# Patient Record
Sex: Female | Born: 1945 | Race: White | Hispanic: No | Smoking: Never smoker
Health system: Southern US, Community
[De-identification: ages and names within clinical notes are randomized; demographics above are authoritative.]

## PROBLEM LIST (undated history)

## (undated) DIAGNOSIS — I10 Essential (primary) hypertension: Secondary | ICD-10-CM

## (undated) DIAGNOSIS — I4891 Unspecified atrial fibrillation: Secondary | ICD-10-CM

## (undated) DIAGNOSIS — E039 Hypothyroidism, unspecified: Secondary | ICD-10-CM

## (undated) DIAGNOSIS — C801 Malignant (primary) neoplasm, unspecified: Secondary | ICD-10-CM

## (undated) DIAGNOSIS — F32A Depression, unspecified: Secondary | ICD-10-CM

## (undated) DIAGNOSIS — F419 Anxiety disorder, unspecified: Secondary | ICD-10-CM

## (undated) DIAGNOSIS — I509 Heart failure, unspecified: Secondary | ICD-10-CM

## (undated) DIAGNOSIS — F329 Major depressive disorder, single episode, unspecified: Secondary | ICD-10-CM

## (undated) HISTORY — PX: HERNIA REPAIR: SHX51

## (undated) HISTORY — PX: NASAL SINUS SURGERY: SHX719

---

## 2005-01-17 ENCOUNTER — Ambulatory Visit: Payer: Self-pay

## 2006-01-20 ENCOUNTER — Ambulatory Visit: Payer: Self-pay

## 2007-01-26 ENCOUNTER — Ambulatory Visit: Payer: Self-pay | Admitting: Internal Medicine

## 2007-05-29 ENCOUNTER — Ambulatory Visit: Payer: Self-pay | Admitting: Unknown Physician Specialty

## 2008-01-27 ENCOUNTER — Ambulatory Visit: Payer: Self-pay | Admitting: Internal Medicine

## 2009-02-23 ENCOUNTER — Ambulatory Visit: Payer: Self-pay | Admitting: Internal Medicine

## 2010-02-26 ENCOUNTER — Ambulatory Visit: Payer: Self-pay | Admitting: Internal Medicine

## 2011-02-28 ENCOUNTER — Ambulatory Visit: Payer: Self-pay | Admitting: Internal Medicine

## 2012-03-02 ENCOUNTER — Ambulatory Visit: Payer: Self-pay | Admitting: Internal Medicine

## 2012-12-07 ENCOUNTER — Inpatient Hospital Stay: Payer: Self-pay | Admitting: Internal Medicine

## 2012-12-07 LAB — CBC
HCT: 40.5 % (ref 35.0–47.0)
HGB: 14.1 g/dL (ref 12.0–16.0)
MCH: 31.8 pg (ref 26.0–34.0)
MCHC: 34.9 g/dL (ref 32.0–36.0)
MCV: 91 fL (ref 80–100)
Platelet: 267 10*3/uL (ref 150–440)
RBC: 4.44 10*6/uL (ref 3.80–5.20)
WBC: 7.7 10*3/uL (ref 3.6–11.0)

## 2012-12-07 LAB — BASIC METABOLIC PANEL
BUN: 10 mg/dL (ref 7–18)
Calcium, Total: 9.1 mg/dL (ref 8.5–10.1)
Co2: 27 mmol/L (ref 21–32)
Creatinine: 0.93 mg/dL (ref 0.60–1.30)
Potassium: 3.9 mmol/L (ref 3.5–5.1)

## 2012-12-07 LAB — CK TOTAL AND CKMB (NOT AT ARMC)
CK-MB: 1 ng/mL (ref 0.5–3.6)
CK-MB: 0.9 ng/mL (ref 0.5–3.6)

## 2012-12-07 LAB — URINALYSIS, COMPLETE
Glucose,UR: NEGATIVE mg/dL (ref 0–75)
Leukocyte Esterase: NEGATIVE
RBC,UR: <1 /HPF (ref 0–5)
Squamous Epithelial: <1
WBC UR: 3 /HPF (ref 0–5)

## 2012-12-07 LAB — T4, FREE: Free Thyroxine: 1.02 ng/dL (ref 0.76–1.46)

## 2012-12-07 LAB — TROPONIN I: Troponin-I: 0.03 ng/mL

## 2012-12-08 LAB — TSH: Thyroid Stimulating Horm: 5.89 u[IU]/mL — ABNORMAL HIGH

## 2013-03-26 ENCOUNTER — Ambulatory Visit: Payer: Self-pay

## 2013-07-23 ENCOUNTER — Ambulatory Visit: Payer: Self-pay | Admitting: Unknown Physician Specialty

## 2014-04-13 ENCOUNTER — Ambulatory Visit: Payer: Self-pay | Admitting: Internal Medicine

## 2014-07-01 NOTE — Discharge Summary (Signed)
PATIENT NAME:  Chelsea Miller, Chelsea Miller MR#:  [id number] DATE OF BIRTH:  10-25-1945  DATE OF ADMISSION:  12/07/2012 DATE OF DISCHARGE:  12/09/2012  FINAL DIAGNOSES:  1.  Atrial fibrillation with rapid ventricular rate.  2.  Hypertension.  3.  Anxiety with depression.   HISTORY AND PHYSICAL: Please see dictated admission history and physical.   [place]: The patient was admitted with atrial fibrillation with rapid ventricular rate. This is new onset atrial fibrillation. Cardiac enzymes were followed, which were negative. Electrolytes were unremarkable. Thyroid was slightly underactive and she was resumed on her home medications for hypothyroidism. She was placed on Cardizem and this was titrated upward. She converted back into normal sinus rhythm. Her Cardizem was consolidated into a single daily dose, which she tolerated well. She was ambulating without issue on this dose.   Echocardiogram was performed, which revealed preserved LV function with moderate tricuspid regurgitation and some ventricular hypertrophy, but no evidence of congestive heart failure. Cardiology saw the patient and they recommended aspirin therapy for anticoagulation and the patient was continued on this.   At this time, the patient will be discharged home in stable condition with physical activity up as tolerated. She will follow a 2 gram sodium diet. She will follow up in our office within the next 2 weeks. She may return to work on 12/14/2012.  DISCHARGE MEDICATIONS:  1.  Synthroid 0.05 mg p.o. daily.  2.  Lexapro 10 mg p.o. daily.  3.  Trazodone 50 mg p.o. at bedtime.  4.  Xanax 1 mg 1 p.o. b.i.d. p.r.n. anxiety.  5.  Multivitamin 1 p.o. daily.  6.  Calcium 600 mg p.o. daily.  7.  Colace 100 mg p.o. daily.  8.  Premarin vaginal suppository once a month.  9.  Aspirin 324 mg p.o. daily.  10. Cardizem CD 240 mg p.o. daily.  11. She was given instructions to hold lisinopril, vitamin E and vitamin C.   ____________________________ Adin Hector, MD bjk:aw D: 12/09/2012 12:58:14 ET T: 12/09/2012 13:06:22 ET JOB#: [id number]  cc: Adin Hector, MD, <Dictator> Isaias Cowman, MD Ramonita Lab MD ELECTRONICALLY SIGNED 12/12/2012 12:46

## 2014-07-01 NOTE — H&P (Signed)
PATIENT NAME:  Chelsea Miller, Chelsea Miller MR#:  [id number] DATE OF BIRTH:  02/12/1946  DATE OF ADMISSION:  12/07/2012  PRIMARY CARE PHYSICIAN: Ramonita Lab, MD   CHIEF COMPLAINT: Palpitations today.   HISTORY OF PRESENT ILLNESS: Chelsea Miller is a pleasant 69 year old Caucasian female with past medical history of hypertension and hypothyroidism who comes to the Emergency Room after she started having significant palpitations, felt like "my heart was going to jump out"  in the morning. She came to the Emergency Room and was found to be in rapid A-fib with heart rate in the 110s to 120. She received 2 doses of IV Cardizem. Internal medicine was contacted for further work-up and evaluation of new onset rapid A-fib. The patient denies currently at present chest pain. Her heart rate is anywhere from 80 to 113, irregularly irregular. It increases to 150s with moving in the bed. Blood pressure is currently stable.   PAST MEDICAL HISTORY: 1.  Hypothyroidism.  2.  Hypertension.  3.  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   ALLERGIES: IVP DYE, SULFA AND LATEX.   MEDICATIONS: 1.  Xanax 1 mg twice a day as needed.  2.  Vitamin E 400 international units daily.  3.  Vitamin C 500 mg p.o. daily.  4.  Trazodone 50 mg at bedtime.  5.  Synthroid 50 mcg p.o. daily.  6.  Premarin cream once a month.  7.  Multivitamin once a day.  8.  Lisinopril 20 mg daily.  9.  Lexapro 10 mg daily.  10.  Colace 100 mg once a day at bedtime.  11.  Calcium 600 with vitamin D p.o. daily.   FAMILY HISTORY: Positive for hypertension.   SOCIAL HISTORY: Nonsmoker, nonalcoholic.   REVIEW OF SYSTEMS:  CONSTITUTIONAL: No fever. Positive for fatigue and weakness.  EYES: No blurred or double vision, glaucoma or cataracts.  ENT: No tinnitus, ear pain, hearing loss.  RESPIRATORY: No cough, wheeze, hemoptysis.  CARDIOVASCULAR: No orthopnea, edema, chest pain. Positive for palpitations. No shortness of breath.  GASTROINTESTINAL: No nausea,  vomiting, diarrhea, abdominal pain.  GENITOURINARY: No dysuria or hematuria.  ENDOCRINE: No polyuria, nocturia or thyroid problems.  HEMATOLOGY: No anemia or easy bruising.  SKIN: No acne, rash or any other lesions.  MUSCULOSKELETAL: Positive for arthritis. No swelling of joints or gout.  NEUROMUSCULAR: No CVA, TIA, dysarthria, ataxia or vertigo.  PSYCHIATRIC: No anxiety or depression. No bipolar disorder. All other systems reviewed and negative.   PHYSICAL EXAMINATION: GENERAL: The patient is awake, alert and oriented x 3, not in acute distress.  VITAL SIGNS: Afebrile, pulse is 89 to 113 and irregularly irregular, respirations 18, blood pressure 141/86 and pulse ox is 98% on room air.  HEENT: Atraumatic, normocephalic. Pupils are equal, round and reactive to light and accommodation. EOM intact. Oral mucosa is moist.  NECK: Supple. No JVD. No carotid bruit.  LUNGS: Clear to auscultation bilaterally. No rales, rhonchi, respiratory distress or labored breathing.  HEART: Both the heart sounds are normal. Rate and rhythm regular. PMI not lateralized. Chest is nontender.  EXTREMITIES: Good pedal pulses. Good femoral pulses. No lower extremity edema.  ABDOMEN: Soft, benign, nontender. No organomegaly. Positive bowel sounds.  NEUROLOGIC: Grossly intact cranial nerves II through XII. No motor or sensory deficit.  PSYCHIATRIC: Awake, alert and oriented x 3.   LABORATORY AND DIAGNOSTICS: EKG shows rapid A-fib.  CBC within normal limits.   Chest x-ray within normal limits.  Basic metabolic panel within normal limits.  Free thyroxine is  1.02.   ASSESSMENT AND PLAN: 69 year old Chelsea Miller with history of hypertension and hypothyroidism comes in with palpitations and is being admitted with:  1.  Rapid atrial fibrillation, new onset, with rapid ventricular response. The patient received a couple doses of Cardizem, slowed her heart rate down into the 80s to 113 which still increases to 140s  with activity in the Emergency Room. She is going to be admitted on telemetry floor. We will start her on p.o. Cardizem 30 q. 6 hourly. Her CHADS-2 score is 1. Will continue enteric-coated aspirin at this time. Cardiology consultation in the morning. We will defer further anticoagulation if needed depending on the patient's heart rate and rhythm, per cardiology.  2.  Hypertension. The patient is going to be on Cardizem. I will hold off on her home dose lisinopril to avoid hypotension.  3.  Anxiety. P.r.n. Xanax.  4.  Hypothyroidism. Continue Synthroid.  5.  Deep vein thrombosis prophylaxis. Subcutaneous heparin t.i.d.   Further work-up according to the patient's clinical course. Hospital admission plan was discussed with the patient and the patient's family members.   TIME SPENT: 50 minutes.  ____________________________ Hart Rochester Posey Pronto, MD sap:sb D: 12/07/2012 16:55:35 ET T: 12/07/2012 17:11:59 ET JOB#: [id number]  cc: Thadd Apuzzo A. Posey Pronto, MD, <Dictator> Tama High III, MD Ilda Basset MD ELECTRONICALLY SIGNED 12/08/2012 14:24

## 2014-07-01 NOTE — Consult Note (Signed)
PATIENT NAME:  Chelsea Miller, Chelsea Miller MR#:  [id number] DATE OF BIRTH:  February 01, 1946  CARDIOLOGY CONSULTATION   DATE OF CONSULTATION:  12/08/2012  CONSULTING PHYSICIAN:  Isaias Cowman, MD  PRIMARY CARE PHYSICIAN: Adin Hector, MD  CHIEF COMPLAINT: Palpitations.   REASON FOR CONSULTATION: Consultation is requested for evaluation of atrial fibrillation.   HISTORY OF PRESENT ILLNESS: The patient is a 69 year old female with a history of hypertension and hypothyroidism. The patient apparently was in her usual state of health until the day of admission, when she started to experience palpitations and heart racing. She presented to [facility] Emergency Room, where she was noted to be in atrial fibrillation with a rapid ventricular rate. The patient was treated with intravenous Cardizem and was admitted to telemetry. The patient has ruled out for myocardial infarction by CPK isoenzymes and troponin. The patient denies palpitations today. She remains in atrial fibrillation with modest rapid ventricular rate. She was started on Cardizem.   PAST MEDICAL HISTORY:  1. Hypertension.  2. Hypothyroidism.  3. Squamous cell carcinoma of the leg.   MEDICATIONS:  1. Lisinopril 20 mg daily.  2. Xanax 1 mg b.i.d. p.r.n. 3. Vitamin E 400 international units daily.  4. Vitamin C 500 mg daily.  5. Trazodone 50 mg at bedtime.  6. Synthroid 50 mcg daily.  7. Premarin cream daily.  8. Multivitamin 1 daily. 9. Lexapro 10 mg daily. 10. Colace 100 mg at bedtime.  11. Calcium 600 with vitamin D 1 daily.   SOCIAL HISTORY: The patient is divorced. She has 2 children. She denies tobacco abuse or EtOH abuse.   FAMILY HISTORY: No immediate family history of coronary artery disease or myocardial infarction.   REVIEW OF SYSTEMS:  CONSTITUTIONAL: No fever or chills.  EYES: No blurry vision.  EARS: No hearing loss.  RESPIRATORY: No shortness of breath.  CARDIOVASCULAR: Palpitations and heart racing, as described  above.  GASTROINTESTINAL: No nausea, vomiting or diarrhea.  GENITOURINARY: No dysuria or hematuria.  ENDOCRINE: No polyuria or polydipsia.  MUSCULOSKELETAL: No arthralgias or myalgias.  NEUROLOGICAL: No focal muscle weakness or numbness.  PSYCHOLOGICAL: No depression or anxiety.   PHYSICAL EXAMINATION:  VITAL SIGNS: Blood pressure 110/70, pulse 104, respirations 16, temperature 97.4, pulse oximetry 94%.  HEENT: Pupils equal and reactive to light and accommodation.  NECK: Supple without thyromegaly.  LUNGS: Clear.  CARDIOVASCULAR: Normal JVP. Normal PMI. Irregularly irregular rhythm. Normal S1, S2. No appreciable gallop, murmur or rub.  ABDOMEN: Soft and nontender. Pulses were intact bilaterally.  MUSCULOSKELETAL: Normal muscle tone.  NEUROLOGICAL: The patient is alert and oriented x3. Motor and sensory both grossly intact.   IMPRESSION: A 69 year old female who presents with new-onset atrial fibrillation with a rapid ventricular rate. The patient has ruled out for myocardial infarction by CPK isoenzymes and troponin. She has a CHADS2 score of 1, currently on aspirin.   RECOMMENDATIONS:  1. Agree with overall current therapy.  2. Continue aspirin for stroke prevention.  3. Up-titrate Cardizem to control heart rate.  4. Review 2-D echocardiogram.   ____________________________ Isaias Cowman, MD ap:OSi D: 12/08/2012 12:55:46 ET T: 12/08/2012 13:03:21 ET JOB#: [id number]  cc: Isaias Cowman, MD, <Dictator> Isaias Cowman MD ELECTRONICALLY SIGNED 12/21/2012 11:26

## 2015-04-17 ENCOUNTER — Other Ambulatory Visit: Payer: Self-pay | Admitting: Internal Medicine

## 2015-04-17 DIAGNOSIS — Z1231 Encounter for screening mammogram for malignant neoplasm of breast: Secondary | ICD-10-CM

## 2015-04-18 ENCOUNTER — Other Ambulatory Visit: Payer: Self-pay | Admitting: Internal Medicine

## 2015-04-18 ENCOUNTER — Ambulatory Visit
Admission: RE | Admit: 2015-04-18 | Discharge: 2015-04-18 | Disposition: A | Discharge status: Home / Self Care | Payer: Medicare Other | Source: Ambulatory Visit | Attending: Internal Medicine | Admitting: Internal Medicine

## 2015-04-18 DIAGNOSIS — Z1231 Encounter for screening mammogram for malignant neoplasm of breast: Secondary | ICD-10-CM

## 2015-04-18 HISTORY — DX: Malignant (primary) neoplasm, unspecified: C80.1

## 2015-05-30 ENCOUNTER — Encounter: Payer: Self-pay | Admitting: Emergency Medicine

## 2015-05-30 ENCOUNTER — Emergency Department: Payer: Medicare Other

## 2015-05-30 ENCOUNTER — Observation Stay
Admission: EM | Admit: 2015-05-30 | Discharge: 2015-05-31 | Disposition: A | Discharge status: Home / Self Care | Payer: Medicare Other | Attending: Internal Medicine | Admitting: Internal Medicine

## 2015-05-30 DIAGNOSIS — F329 Major depressive disorder, single episode, unspecified: Secondary | ICD-10-CM | POA: Diagnosis not present

## 2015-05-30 DIAGNOSIS — R0602 Shortness of breath: Secondary | ICD-10-CM | POA: Insufficient documentation

## 2015-05-30 DIAGNOSIS — Z91040 Latex allergy status: Secondary | ICD-10-CM | POA: Insufficient documentation

## 2015-05-30 DIAGNOSIS — Z79899 Other long term (current) drug therapy: Secondary | ICD-10-CM | POA: Diagnosis not present

## 2015-05-30 DIAGNOSIS — J90 Pleural effusion, not elsewhere classified: Secondary | ICD-10-CM | POA: Diagnosis not present

## 2015-05-30 DIAGNOSIS — R112 Nausea with vomiting, unspecified: Secondary | ICD-10-CM | POA: Insufficient documentation

## 2015-05-30 DIAGNOSIS — R0789 Other chest pain: Secondary | ICD-10-CM | POA: Diagnosis not present

## 2015-05-30 DIAGNOSIS — I480 Paroxysmal atrial fibrillation: Secondary | ICD-10-CM | POA: Insufficient documentation

## 2015-05-30 DIAGNOSIS — Z7982 Long term (current) use of aspirin: Secondary | ICD-10-CM | POA: Insufficient documentation

## 2015-05-30 DIAGNOSIS — Z91041 Radiographic dye allergy status: Secondary | ICD-10-CM | POA: Diagnosis not present

## 2015-05-30 DIAGNOSIS — F419 Anxiety disorder, unspecified: Secondary | ICD-10-CM | POA: Insufficient documentation

## 2015-05-30 DIAGNOSIS — I491 Atrial premature depolarization: Secondary | ICD-10-CM | POA: Diagnosis not present

## 2015-05-30 DIAGNOSIS — I081 Rheumatic disorders of both mitral and tricuspid valves: Secondary | ICD-10-CM | POA: Insufficient documentation

## 2015-05-30 DIAGNOSIS — Z7901 Long term (current) use of anticoagulants: Secondary | ICD-10-CM | POA: Insufficient documentation

## 2015-05-30 DIAGNOSIS — I5032 Chronic diastolic (congestive) heart failure: Secondary | ICD-10-CM | POA: Insufficient documentation

## 2015-05-30 DIAGNOSIS — Z882 Allergy status to sulfonamides status: Secondary | ICD-10-CM | POA: Diagnosis not present

## 2015-05-30 DIAGNOSIS — R079 Chest pain, unspecified: Secondary | ICD-10-CM | POA: Diagnosis present

## 2015-05-30 DIAGNOSIS — E039 Hypothyroidism, unspecified: Secondary | ICD-10-CM | POA: Insufficient documentation

## 2015-05-30 DIAGNOSIS — E785 Hyperlipidemia, unspecified: Secondary | ICD-10-CM | POA: Diagnosis not present

## 2015-05-30 DIAGNOSIS — I110 Hypertensive heart disease with heart failure: Secondary | ICD-10-CM | POA: Diagnosis not present

## 2015-05-30 DIAGNOSIS — I509 Heart failure, unspecified: Secondary | ICD-10-CM | POA: Diagnosis present

## 2015-05-30 DIAGNOSIS — Z8249 Family history of ischemic heart disease and other diseases of the circulatory system: Secondary | ICD-10-CM | POA: Diagnosis not present

## 2015-05-30 HISTORY — DX: Heart failure, unspecified: I50.9

## 2015-05-30 HISTORY — DX: Depression, unspecified: F32.A

## 2015-05-30 HISTORY — DX: Anxiety disorder, unspecified: F41.9

## 2015-05-30 HISTORY — DX: Unspecified atrial fibrillation: I48.91

## 2015-05-30 HISTORY — DX: Essential (primary) hypertension: I10

## 2015-05-30 HISTORY — DX: Hypothyroidism, unspecified: E03.9

## 2015-05-30 HISTORY — DX: Major depressive disorder, single episode, unspecified: F32.9

## 2015-05-30 LAB — CBC
HCT: 34 % — ABNORMAL LOW (ref 35.0–47.0)
Hemoglobin: 11.5 g/dL — ABNORMAL LOW (ref 12.0–16.0)
MCH: 31.7 pg (ref 26.0–34.0)
MCHC: 33.8 g/dL (ref 32.0–36.0)
MCV: 93.6 fL (ref 80.0–100.0)
PLATELETS: 211 10*3/uL (ref 150–440)
RBC: 3.63 MIL/uL — ABNORMAL LOW (ref 3.80–5.20)
RDW: 13.1 % (ref 11.5–14.5)
WBC: 7.7 10*3/uL (ref 3.6–11.0)

## 2015-05-30 LAB — BASIC METABOLIC PANEL
ANION GAP: 5 (ref 5–15)
BUN: 12 mg/dL (ref 6–20)
CALCIUM: 8.8 mg/dL — AB (ref 8.9–10.3)
CO2: 24 mmol/L (ref 22–32)
Chloride: 105 mmol/L (ref 101–111)
Creatinine, Ser: 0.77 mg/dL (ref 0.44–1.00)
Glucose, Bld: 116 mg/dL — ABNORMAL HIGH (ref 65–99)
POTASSIUM: 3.9 mmol/L (ref 3.5–5.1)
Sodium: 134 mmol/L — ABNORMAL LOW (ref 135–145)

## 2015-05-30 LAB — LIPID PANEL
CHOL/HDL RATIO: 3 ratio
CHOLESTEROL: 221 mg/dL — AB (ref 0–200)
HDL: 74 mg/dL (ref >40)
LDL CALC: 116 mg/dL — AB (ref 0–99)
Triglycerides: 153 mg/dL — ABNORMAL HIGH (ref <150)
VLDL: 31 mg/dL (ref 0–40)

## 2015-05-30 LAB — FIBRIN DERIVATIVES D-DIMER (ARMC ONLY): Fibrin derivatives D-dimer (ARMC): 745 — ABNORMAL HIGH (ref 0–499)

## 2015-05-30 LAB — TROPONIN I
TROPONIN I: 0.03 ng/mL (ref <0.031)
Troponin I: <0.03 ng/mL (ref <0.031)

## 2015-05-30 MED ORDER — HYDRALAZINE HCL 20 MG/ML IJ SOLN: 10.0000 mg | Freq: Four times a day (QID) | INTRAMUSCULAR | Status: DC | PRN

## 2015-05-30 MED ORDER — ACETAMINOPHEN 650 MG RE SUPP: 650.0000 mg | Freq: Four times a day (QID) | RECTAL | Status: DC | PRN

## 2015-05-30 MED ORDER — ASPIRIN 81 MG PO CHEW
324.0000 mg | CHEWABLE_TABLET | Freq: Once | ORAL | Status: AC
  Administered 2015-05-30: 324 mg via ORAL
  Filled 2015-05-30: qty 4

## 2015-05-30 MED ORDER — VITAMIN C 500 MG PO TABS
500.0000 mg | ORAL_TABLET | Freq: Every day | ORAL | Status: DC
  Administered 2015-05-31: 500 mg via ORAL
  Filled 2015-05-30: qty 1

## 2015-05-30 MED ORDER — CITALOPRAM HYDROBROMIDE 20 MG PO TABS
20.0000 mg | ORAL_TABLET | Freq: Once | ORAL | Status: AC
  Administered 2015-05-30: 20 mg via ORAL

## 2015-05-30 MED ORDER — RIZATRIPTAN BENZOATE 10 MG PO TBDP: 10.0000 mg | ORAL_TABLET | ORAL | Status: DC | PRN

## 2015-05-30 MED ORDER — FUROSEMIDE 10 MG/ML IJ SOLN: 40.0000 mg | Freq: Every day | INTRAMUSCULAR | Status: DC

## 2015-05-30 MED ORDER — LISINOPRIL 20 MG PO TABS: 20.0000 mg | ORAL_TABLET | Freq: Every day | ORAL | Status: DC

## 2015-05-30 MED ORDER — ACETAMINOPHEN 325 MG PO TABS
650.0000 mg | ORAL_TABLET | Freq: Four times a day (QID) | ORAL | Status: DC | PRN
Start: 2015-05-30 — End: 2015-05-31
  Administered 2015-05-30: 650 mg via ORAL
  Filled 2015-05-30: qty 2

## 2015-05-30 MED ORDER — CITALOPRAM HYDROBROMIDE 20 MG PO TABS
20.0000 mg | ORAL_TABLET | Freq: Every day | ORAL | Status: DC
  Administered 2015-05-31: 20 mg via ORAL
  Filled 2015-05-30 (×2): qty 1

## 2015-05-30 MED ORDER — METOPROLOL SUCCINATE ER 50 MG PO TB24: 50.0000 mg | ORAL_TABLET | Freq: Every day | ORAL | Status: DC

## 2015-05-30 MED ORDER — ALPRAZOLAM 1 MG PO TABS
1.0000 mg | ORAL_TABLET | Freq: Three times a day (TID) | ORAL | Status: DC | PRN
  Administered 2015-05-30: 2 mg via ORAL
  Filled 2015-05-30: qty 2

## 2015-05-30 MED ORDER — SODIUM CHLORIDE 0.9% FLUSH
3.0000 mL | Freq: Two times a day (BID) | INTRAVENOUS | Status: DC
  Administered 2015-05-30 – 2015-05-31 (×2): 3 mL via INTRAVENOUS

## 2015-05-30 MED ORDER — FUROSEMIDE 10 MG/ML IJ SOLN
40.0000 mg | Freq: Every day | INTRAMUSCULAR | Status: DC
  Administered 2015-05-31: 40 mg via INTRAVENOUS
  Filled 2015-05-30 (×2): qty 4

## 2015-05-30 MED ORDER — CALCIUM CARBONATE-VITAMIN D 500-200 MG-UNIT PO TABS
1.0000 | ORAL_TABLET | Freq: Every day | ORAL | Status: DC
Start: 2015-05-31 — End: 2015-05-31
  Administered 2015-05-31: 1 via ORAL
  Filled 2015-05-30: qty 1

## 2015-05-30 MED ORDER — CALCIUM-MAGNESIUM-VITAMIN D ER 600-40-500 MG-MG-UNIT PO TB24: 1.0000 | ORAL_TABLET | Freq: Every day | ORAL | Status: DC

## 2015-05-30 MED ORDER — FUROSEMIDE 10 MG/ML IJ SOLN
40.0000 mg | Freq: Once | INTRAMUSCULAR | Status: AC
Start: 2015-05-30 — End: 2015-05-30
  Administered 2015-05-30: 40 mg via INTRAVENOUS
  Filled 2015-05-30: qty 4

## 2015-05-30 MED ORDER — LEVOTHYROXINE SODIUM 50 MCG PO TABS
50.0000 ug | ORAL_TABLET | Freq: Every day | ORAL | Status: DC
  Administered 2015-05-31: 50 ug via ORAL
  Filled 2015-05-30 (×2): qty 1

## 2015-05-30 MED ORDER — METOPROLOL SUCCINATE ER 50 MG PO TB24
50.0000 mg | ORAL_TABLET | Freq: Every day | ORAL | Status: DC
  Administered 2015-05-31: 50 mg via ORAL
  Filled 2015-05-30: qty 1

## 2015-05-30 MED ORDER — ASPIRIN EC 325 MG PO TBEC
325.0000 mg | DELAYED_RELEASE_TABLET | Freq: Every day | ORAL | Status: DC
Start: 2015-05-30 — End: 2015-05-30

## 2015-05-30 MED ORDER — VITAMIN D 1000 UNITS PO TABS
500.0000 [IU] | ORAL_TABLET | Freq: Every day | ORAL | Status: DC
  Administered 2015-05-31: 500 [IU] via ORAL
  Filled 2015-05-30: qty 0.5
  Filled 2015-05-30: qty 1

## 2015-05-30 MED ORDER — SUMATRIPTAN SUCCINATE 50 MG PO TABS
50.0000 mg | ORAL_TABLET | ORAL | Status: DC | PRN
  Filled 2015-05-30: qty 1

## 2015-05-30 MED ORDER — VITAMIN C 500 MG PO TABS: 500.0000 mg | ORAL_TABLET | Freq: Every day | ORAL | Status: DC

## 2015-05-30 MED ORDER — LISINOPRIL 20 MG PO TABS
20.0000 mg | ORAL_TABLET | Freq: Every day | ORAL | Status: DC
  Administered 2015-05-31: 20 mg via ORAL
  Filled 2015-05-30: qty 1

## 2015-05-30 MED ORDER — NITROGLYCERIN 2 % TD OINT
0.5000 [in_us] | TOPICAL_OINTMENT | Freq: Four times a day (QID) | TRANSDERMAL | Status: DC
  Filled 2015-05-30: qty 1

## 2015-05-30 MED ORDER — ASPIRIN EC 325 MG PO TBEC
325.0000 mg | DELAYED_RELEASE_TABLET | Freq: Every day | ORAL | Status: DC
  Administered 2015-05-31: 325 mg via ORAL
  Filled 2015-05-30: qty 1

## 2015-05-30 MED ORDER — TRAZODONE HCL 50 MG PO TABS
75.0000 mg | ORAL_TABLET | Freq: Every day | ORAL | Status: DC
  Administered 2015-05-30: 75 mg via ORAL
  Filled 2015-05-30: qty 2

## 2015-05-30 MED ORDER — ENOXAPARIN SODIUM 40 MG/0.4ML ~~LOC~~ SOLN
40.0000 mg | SUBCUTANEOUS | Status: DC
  Administered 2015-05-30: 40 mg via SUBCUTANEOUS
  Filled 2015-05-30: qty 0.4

## 2015-05-30 MED ORDER — METAXALONE 800 MG PO TABS
800.0000 mg | ORAL_TABLET | Freq: Three times a day (TID) | ORAL | Status: DC | PRN
  Filled 2015-05-30: qty 1

## 2015-05-30 NOTE — ED Notes (Signed)
Patient presents to the ED with chest pain since Sunday.  Patient saw her PCP today and was instructed to come to the ED.  Patient has a history of Afib and takes metoprolol.  Patient describes chest pain as a pressure to the center of her chest that is constant.  Patient took 1 baby aspirin this morning.

## 2015-05-30 NOTE — ED Provider Notes (Signed)
[facility] Emergency Department Provider Note  Time seen: 3:26 PM  I have reviewed the triage vital signs and the nursing notes.   HISTORY  Chief Complaint Chest Pain    HPI Chelsea Miller is a 70 y.o. female with a past medical history of cancer, atrial fibrillation, hypertension who presents the emergency department with chest pain and shortness of breath. According to the patient 4 days ago she was expressing palpitations, seen her primary care doctor's office and diagnosed with paroxysmal atrial fibrillation. Patient's metoprolol was increased from 25 mg to 50 mg. Patient states over the weekend she has began developing intermittent chest pain was states it has been much more constant over the past 24-48 hours. She describes chest pain as a pressure sensation located mostly in the center of her chest, with trouble breathing much worse with exertion or attempting to lay flat. Primary care doctor has scheduled the patient for an ultrasound/echocardiogram tomorrow however given her worsening symptoms he urged her to come to the emergency department for admission to the hospital per patient. Patient denies any lower extremity edema/swelling. Denies any history of CHF in the past. Describes her chest discomfort as mild to moderate currently pressure sensation to the center of her chest. Denies any nausea or diaphoresis.     Past Medical History  Diagnosis Date  . Cancer ([place])     skin  . A-fib ([place])   . Hypertension     There are no active problems to display for this patient.   Past Surgical History  Procedure Laterality Date  . Hernia repair      No current outpatient prescriptions on file.  Allergies Ivp dye and Sulfa antibiotics  Family History  Problem Relation Age of Onset  . Breast cancer Paternal Aunt 27    Social History Social History  Substance Use Topics  . Smoking status: Never Smoker   . Smokeless tobacco: None  . Alcohol  Use: Yes     Comment: occasionally    Review of Systems Constitutional: Negative for fever. Cardiovascular: Central chest pressure. Respiratory: Positive for shortness of breath. Gastrointestinal: Negative for abdominal pain, vomiting and diarrhea. Genitourinary: Negative for dysuria. Musculoskeletal: Negative for back pain Neurological: Negative for headache 10-point ROS otherwise negative.  ____________________________________________   PHYSICAL EXAM:  VITAL SIGNS: ED Triage Vitals  Enc Vitals Group     BP 05/30/15 1210 148/109 mmHg     Pulse Rate 05/30/15 1210 66     Resp 05/30/15 1210 16     Temp 05/30/15 1210 98.3 F (36.8 C)     Temp Source 05/30/15 1210 Oral     SpO2 05/30/15 1210 95 %     Weight 05/30/15 1210 154 lb (69.854 kg)     Height 05/30/15 1210 5\' 7"  (1.702 m)     Head Cir --      Peak Flow --      Pain Score 05/30/15 1211 7     Pain Loc --      Pain Edu? --      Excl. in [place]? --     Constitutional: Alert and oriented. Well appearing and in no distress. Eyes: Normal exam ENT   Head: Normocephalic and atraumatic.   Mouth/Throat: Mucous membranes are moist. Cardiovascular: Normal rate, regular rhythm. No murmur Respiratory: Normal respiratory effort without tachypnea nor retractions. Breath sounds are clear. Nontender to palpation. Gastrointestinal: Soft and nontender. No distention.  Musculoskeletal: Nontender with normal range of motion in all  extremities. No lower extremity tenderness or edema. Neurologic:  Normal speech and language. No gross focal neurologic deficits  Skin:  Skin is warm, dry and intact.  Psychiatric: Mood and affect are normal. Speech and behavior are normal.  ____________________________________________    EKG  EKG reviewed and interpreted by myself shows sinus rhythm at 72 bpm, narrow QRS, normal axis, normal intervals, nonspecific ST changes. No ST elevations.  ____________________________________________     RADIOLOGY  Chest x-ray consistent vascular congestion and small pleural effusions.  ____________________________________________    INITIAL IMPRESSION / ASSESSMENT AND PLAN / ED COURSE  Pertinent labs & imaging results that were available during my care of the patient were reviewed by me and considered in my medical decision making (see chart for details).  Patient presents with shortness of breath worsening over the past 3-4 days, now with chest pain over the past 24 hours. Patient's labs are largely within normal limits. Patient's troponin is negative. Chest x-ray consistent with vascular congestion. Patient states she has been experiencing progressively worsening shortness of breath, can no longer lie flat has use 3 or 4 pillows at night. Becomes very short of breath with exertion. Symptoms and x-ray are suggestive of congestive heart failure. Patient has no history of congestive heart failure. Given the patient's chest pain, age, comorbidities, we will admit to the hospital for further treatment and monitoring.  ____________________________________________   FINAL CLINICAL IMPRESSION(S) / ED DIAGNOSES  Congestive heart failure Chest pain   Harvest Dark, MD 05/30/15 1600

## 2015-05-30 NOTE — H&P (Signed)
[facility] [place] [facility] NAME: Chelsea Miller    MR#:  [id number]  DATE OF BIRTH:  1945-07-26  DATE OF ADMISSION:  05/30/2015  PRIMARY CARE PHYSICIAN: Dr. Ramonita Lab  REQUESTING/REFERRING PHYSICIAN: Dr. Harvest Dark  CHIEF COMPLAINT:   Chief Complaint  Patient presents with  . Chest Pain    HISTORY OF PRESENT ILLNESS:  Chelsea Miller  is a 70 y.o. female with a known history of paroxysmal atrial fibrillation not on anticoagulation, hypothyroidism, hypertension and depression and anxiety presents to the emergency room from her PCPs office due to chest pain and shortness of breath. Patient's symptoms started last week when she was having palpitations and went to see her PCP. Noted to be in atrial fibrillation with heart rate in the 130s. Her lisinopril dose was decreased and her metoprolol dose was increased and she was discharged home. Her palpitations have improved but she slowly started noticing shortness of breath which gets worse and when she lays flat. In the last couple of days she has noted chest tightness and make midsternal region nonradiating. Associated with nausea since last night and one episode of vomiting. She occasionally still feels palpitations and overall feels fatigued and weak. She went to see her PCP today, EKG showed that she was in normal sinus rhythm. But sent in to the emergency room because of worsening chest pain and also shortness of breath. Her sats were greater than 92% on room air at this time.  PAST MEDICAL HISTORY:   Past Medical History  Diagnosis Date  . Cancer ([place])     skin  . A-fib ([facility])     Paroxysmal afib  . Hypertension   . Hypothyroidism   . Depression   . Anxiety     PAST SURGICAL HISTORY:   Past Surgical History  Procedure Laterality Date  . Hernia repair    . Nasal sinus surgery      SOCIAL HISTORY:   Social History  Substance Use Topics  . Smoking status: Never Smoker    . Smokeless tobacco: Not on file  . Alcohol Use: 0.0 oz/week    0 Standard drinks or equivalent per week     Comment: occasionally    FAMILY HISTORY:   Family History  Problem Relation Age of Onset  . Breast cancer Paternal Aunt 13  . CAD Mother   . Lung cancer Father     DRUG ALLERGIES:   Allergies  Allergen Reactions  . Ivp Dye [Iodinated Diagnostic Agents] Other (See Comments)    Reaction:  Dizziness   . Sulfa Antibiotics Nausea And Vomiting  . Latex Rash    REVIEW OF SYSTEMS:   Review of Systems  Constitutional: Positive for malaise/fatigue. Negative for fever, chills and weight loss.  HENT: Negative for ear discharge, ear pain, nosebleeds and tinnitus.   Eyes: Negative for blurred vision, double vision and photophobia.  Respiratory: Positive for shortness of breath. Negative for cough, hemoptysis and wheezing.   Cardiovascular: Positive for chest pain and palpitations. Negative for orthopnea and leg swelling.  Gastrointestinal: Positive for nausea and vomiting. Negative for heartburn, abdominal pain, diarrhea, constipation and melena.  Genitourinary: Negative for dysuria, urgency, frequency and hematuria.  Musculoskeletal: Negative for myalgias, back pain and neck pain.  Skin: Negative for rash.  Neurological: Negative for dizziness, tingling, tremors, sensory change, speech change, focal weakness and headaches.  Endo/Heme/Allergies: Does not bruise/bleed easily.  Psychiatric/Behavioral: Negative for depression.    MEDICATIONS AT HOME:  Prior to Admission medications   Medication Sig Start Date End Date Taking? Authorizing Provider  ALPRAZolam Duanne Moron) 1 MG tablet Take 1-2 mg by mouth 3 (three) times daily as needed for anxiety or sleep.   Yes Historical Provider, MD  aspirin EC 81 MG tablet Take 81 mg by mouth daily.   Yes Historical Provider, MD  Calcium-Magnesium-Vitamin D (CALCIUM 1200+D3 PO) Take 1 tablet by mouth daily.   Yes Historical Provider, MD   cholecalciferol (VITAMIN D) 1000 units tablet Take 500 Units by mouth daily.   Yes Historical Provider, MD  citalopram (CELEXA) 20 MG tablet Take 20 mg by mouth daily.   Yes Historical Provider, MD  estradiol (ESTRACE) 0.1 MG/GM vaginal cream Place 1 Applicatorful vaginally every 30 (thirty) days.   Yes Historical Provider, MD  levothyroxine (SYNTHROID, LEVOTHROID) 50 MCG tablet Take 50 mcg by mouth daily before breakfast.   Yes Historical Provider, MD  lisinopril (PRINIVIL,ZESTRIL) 20 MG tablet Take 20 mg by mouth daily.   Yes Historical Provider, MD  metaxalone (SKELAXIN) 800 MG tablet Take 800 mg by mouth 3 (three) times daily as needed for muscle spasms.   Yes Historical Provider, MD  metoprolol succinate (TOPROL-XL) 50 MG 24 hr tablet Take 50 mg by mouth daily. Take with or immediately following a meal.   Yes Historical Provider, MD  rizatriptan (MAXALT-MLT) 10 MG disintegrating tablet Take 10 mg by mouth as needed for migraine. May repeat in 2 hours if needed   Yes Historical Provider, MD  traZODone (DESYREL) 50 MG tablet Take 75 mg by mouth at bedtime.   Yes Historical Provider, MD  vitamin C (ASCORBIC ACID) 500 MG tablet Take 500 mg by mouth daily.   Yes Historical Provider, MD      VITAL SIGNS:  Blood pressure 169/99, pulse 65, temperature 98.3 F (36.8 C), temperature source Oral, resp. rate 16, height 5\' 7"  (1.702 m), weight 69.854 kg (154 lb), SpO2 94 %.  PHYSICAL EXAMINATION:   Physical Exam  GENERAL:  70 y.o.-year-old patient lying in the bed with no acute distress.  EYES: Pupils equal, round, reactive to light and accommodation. No scleral icterus. Extraocular muscles intact.  HEENT: Head atraumatic, normocephalic. Oropharynx and nasopharynx clear.  NECK:  Supple, no jugular venous distention. No thyroid enlargement, no tenderness.  LUNGS: Normal breath sounds bilaterally, no wheezing, rhonchi or crepitation. Fine bibasilar crackles. No use of accessory muscles of  respiration.  CARDIOVASCULAR: S1, S2 normal. No murmurs, rubs, or gallops.  ABDOMEN: Soft, nontender, nondistended. Bowel sounds present. No organomegaly or mass.  EXTREMITIES: No pedal edema, cyanosis, or clubbing. No calf tenderness NEUROLOGIC: Cranial nerves II through XII are intact. Muscle strength 5/5 in all extremities. Sensation intact. Gait not checked.  PSYCHIATRIC: The patient is alert and oriented x 3.  SKIN: No obvious rash, lesion, or ulcer.   LABORATORY PANEL:   CBC  Recent Labs Lab 05/30/15 1218  WBC 7.7  HGB 11.5*  HCT 34.0*  PLT 211   ------------------------------------------------------------------------------------------------------------------  Chemistries   Recent Labs Lab 05/30/15 1218  NA 134*  K 3.9  CL 105  CO2 24  GLUCOSE 116*  BUN 12  CREATININE 0.77  CALCIUM 8.8*   ------------------------------------------------------------------------------------------------------------------  Cardiac Enzymes  Recent Labs Lab 05/30/15 1218  TROPONINI <0.03   ------------------------------------------------------------------------------------------------------------------  RADIOLOGY:  Dg Chest 2 View  05/30/2015  CLINICAL DATA:  Chest pain for 2 days, initial encounter EXAM: CHEST  2 VIEW COMPARISON:  12/07/2012 FINDINGS: Cardiac shadow is at the upper  limits of normal in size. Small bilateral pleural effusions are noted as well as mild vascular congestion. No focal confluent infiltrate is seen. No bony abnormality is noted. IMPRESSION: Mild vascular congestion and small pleural effusions. Electronically Signed   By: Inez Catalina M.D.   On: 05/30/2015 13:11    EKG:   Orders placed or performed during the hospital encounter of 05/30/15  . EKG 12-Lead  . EKG 12-Lead  . ED EKG within 10 minutes  . ED EKG within 10 minutes    IMPRESSION AND PLAN:   Chelsea Miller  is a 70 y.o. female with a known history of paroxysmal atrial fibrillation not on  anticoagulation, hypothyroidism, hypertension and depression and anxiety presents to the emergency room from her PCPs office due to chest pain and shortness of breath.  #1 chest pain- either related to CHF versus unstable angina. -First troponin is negative. Admit to telemetry. Recycle troponins. -Cardiology consulted. D-dimer ordered. Follow-up echocardiogram. -Continue aspirin. Check lipid panel. Already on lisinopril and metoprolol. -If rules out for MI, Myoview ordered for tomorrow morning.  #2 CHF-no prior history. Chest x-ray with mild pulmonary edema. -Could be tachycardia induced cardiomyopathy resulting in pulmonary edema versus underlying CHF. -Admit to telemetry. Lasix IV started daily. -Check echocardiogram. Cardiology has been consulted. -Sats are stable on room air.  #3 paroxysmal atrial fibrillation- currently in normal sinus rhythm. -Continue metoprolol. Currently on aspirin. -Cardiology consulted. Will benefit from anticoagulation. Need to be started prior to discharge.  #4 hypertension- on lisinopril and metoprolol for now. IV hydralazine as needed has been added  #5 depression and anxiety-continue home medications. On Celexa, trazodone and Xanax  #6 hypothyroidism-continue Synthroid  #7 DVT prophylaxis-on Lovenox    All the records are reviewed and case discussed with ED provider. Management plans discussed with the patient, family and they are in agreement.  CODE STATUS: Full Code  TOTAL TIME TAKING CARE OF THIS PATIENT: 55 minutes.    Gladstone Lighter M.D on 05/30/2015 at 4:47 PM  Between 7am to 6pm - Pager - [phone]  After 6pm go to [email or web address] - password [email or web address] [facility]  [facility]  Office  [phone]  CC: Primary care physician; No primary care provider on file.

## 2015-05-31 ENCOUNTER — Observation Stay: Payer: Medicare Other

## 2015-05-31 ENCOUNTER — Observation Stay
Admit: 2015-05-31 | Discharge: 2015-05-31 | Disposition: A | Discharge status: Home / Self Care | Payer: Medicare Other | Attending: Internal Medicine | Admitting: Internal Medicine

## 2015-05-31 ENCOUNTER — Encounter: Payer: Self-pay | Admitting: Radiology

## 2015-05-31 DIAGNOSIS — R0789 Other chest pain: Secondary | ICD-10-CM | POA: Diagnosis not present

## 2015-05-31 LAB — NM MYOCAR MULTI W/SPECT W/WALL MOTION / EF
CHL CUP NUCLEAR SDS: 0
CHL CUP NUCLEAR SSS: 1
CSEPED: 8 min
CSEPHR: 88 %
CSEPPHR: 136 {beats}/min
Estimated workload: 10.1 METS
Exercise duration (sec): 51 s
LVDIAVOL: 76 mL (ref 46–106)
LVSYSVOL: 25 mL
MPHR: 153 {beats}/min
Rest HR: 67 {beats}/min
SRS: 8
TID: 0.98

## 2015-05-31 LAB — BASIC METABOLIC PANEL
ANION GAP: 4 — AB (ref 5–15)
BUN: 14 mg/dL (ref 6–20)
CALCIUM: 8.8 mg/dL — AB (ref 8.9–10.3)
CHLORIDE: 107 mmol/L (ref 101–111)
CO2: 26 mmol/L (ref 22–32)
Creatinine, Ser: 0.75 mg/dL (ref 0.44–1.00)
GFR calc non Af Amer: >60 mL/min (ref >60)
Glucose, Bld: 96 mg/dL (ref 65–99)
Potassium: 3.9 mmol/L (ref 3.5–5.1)
SODIUM: 137 mmol/L (ref 135–145)

## 2015-05-31 LAB — CBC
HCT: 35.8 % (ref 35.0–47.0)
HEMOGLOBIN: 12.6 g/dL (ref 12.0–16.0)
MCH: 32.6 pg (ref 26.0–34.0)
MCHC: 35.2 g/dL (ref 32.0–36.0)
MCV: 92.6 fL (ref 80.0–100.0)
Platelets: 211 10*3/uL (ref 150–440)
RBC: 3.86 MIL/uL (ref 3.80–5.20)
RDW: 12.9 % (ref 11.5–14.5)
WBC: 6 10*3/uL (ref 3.6–11.0)

## 2015-05-31 LAB — TROPONIN I
Troponin I: <0.03 ng/mL (ref <0.031)
Troponin I: <0.03 ng/mL (ref <0.031)

## 2015-05-31 LAB — ECHOCARDIOGRAM COMPLETE
Height: 67 in
WEIGHTICAEL: 2464 [oz_av]

## 2015-05-31 MED ORDER — APIXABAN 5 MG PO TABS: 5.0000 mg | ORAL_TABLET | Freq: Two times a day (BID) | ORAL | Status: AC

## 2015-05-31 MED ORDER — AMLODIPINE BESYLATE 5 MG PO TABS: 5.0000 mg | ORAL_TABLET | Freq: Every day | ORAL | Status: AC

## 2015-05-31 MED ORDER — TECHNETIUM TC 99M SESTAMIBI - CARDIOLITE
13.0000 | Freq: Once | INTRAVENOUS | Status: AC | PRN
  Administered 2015-05-31: 12.607 via INTRAVENOUS

## 2015-05-31 MED ORDER — FUROSEMIDE 20 MG PO TABS: 20.0000 mg | ORAL_TABLET | Freq: Every day | ORAL | Status: AC | PRN

## 2015-05-31 NOTE — Progress Notes (Signed)
Per original order, wait for ECHO results, still have not resulted. ECHO tech stated he saw nothing of concern while performing the ECHO and patient is ready for discharge. Dr. Tressia Miners paged and stated okay to go ahead and discharge. Prescriptions are already in the chart. Will prepare discharge now.

## 2015-05-31 NOTE — Care Management Important Message (Deleted)
Important Message  Patient Details  Name: Chelsea Miller MRN: [id number] Date of Birth: 05-09-1945   Medicare Important Message Given:  Yes    Jolly Mango, RN 05/31/2015, 1:40 PM

## 2015-05-31 NOTE — Care Management Obs Status (Signed)
[facility] NOTIFICATION   Patient Details  Name: Chelsea Miller MRN: [id number] Date of Birth: 1945/11/03   Medicare Observation Status Notification Given:  Yes    Jolly Mango, RN 05/31/2015, 1:40 PM

## 2015-05-31 NOTE — Consult Note (Signed)
[facility] Consultation Note  Patient ID: Chelsea Miller, MRN: [id number], DOB/AGE: Sep 20, 1945 70 y.o. Admit date: 05/30/2015   Date of Consult: 05/31/2015 Primary Physician: No primary care provider on file. Primary Cardiologist: None  Chief Complaint:  Chief Complaint  Patient presents with  . Chest Pain   Reason for Consult: atrial fibrillation with diastolic dysfunction  HPI: 70 y.o. female with the paroxysmal nonvalvular atrial fibrillation and essential hypertension with the recent onset of symptoms of shortness of breath PND orthopnea and some mild chest discomfort. The patient has this waxing and waning over a four-day period for which shows she was seen in the office. At that time she had atrial fibrillation with rapid ventricular rate and symptoms consistent with diastolic dysfunction congestive heart failure. The patient had intravenous Lasix with full relief of her shortness of breath and spontaneously converted to normal sinus rhythm. The patient now feels quite well with the and no further episodes of chest discomfort and a troponin normal without evidence of myocardial infarction. EKG shows normal sinus rhythm. The patient appears to have the symptoms most consistent with atrial fibrillation at this time  Past Medical History  Diagnosis Date  . Cancer ([place])     skin  . A-fib ([facility])     Paroxysmal afib  . Hypertension   . Hypothyroidism   . Depression   . Anxiety   . CHF (congestive heart failure) [facility])       Surgical History:  Past Surgical History  Procedure Laterality Date  . Hernia repair    . Nasal sinus surgery       Home Meds: Prior to Admission medications   Medication Sig Start Date End Date Taking? Authorizing Provider  ALPRAZolam Duanne Moron) 1 MG tablet Take 1-2 mg by mouth 3 (three) times daily as needed for anxiety or sleep.   Yes Historical Provider, MD  aspirin EC 81 MG tablet Take 81 mg by mouth daily.   Yes Historical Provider, MD   Calcium-Magnesium-Vitamin D (CALCIUM 1200+D3 PO) Take 1 tablet by mouth daily.   Yes Historical Provider, MD  cholecalciferol (VITAMIN D) 1000 units tablet Take 500 Units by mouth daily.   Yes Historical Provider, MD  citalopram (CELEXA) 20 MG tablet Take 20 mg by mouth daily.   Yes Historical Provider, MD  estradiol (ESTRACE) 0.1 MG/GM vaginal cream Place 1 Applicatorful vaginally every 30 (thirty) days.   Yes Historical Provider, MD  levothyroxine (SYNTHROID, LEVOTHROID) 50 MCG tablet Take 50 mcg by mouth daily before breakfast.   Yes Historical Provider, MD  lisinopril (PRINIVIL,ZESTRIL) 20 MG tablet Take 20 mg by mouth daily.   Yes Historical Provider, MD  metaxalone (SKELAXIN) 800 MG tablet Take 800 mg by mouth 3 (three) times daily as needed for muscle spasms.   Yes Historical Provider, MD  metoprolol succinate (TOPROL-XL) 50 MG 24 hr tablet Take 50 mg by mouth daily. Take with or immediately following a meal.   Yes Historical Provider, MD  rizatriptan (MAXALT-MLT) 10 MG disintegrating tablet Take 10 mg by mouth as needed for migraine. May repeat in 2 hours if needed   Yes Historical Provider, MD  traZODone (DESYREL) 50 MG tablet Take 75 mg by mouth at bedtime.   Yes Historical Provider, MD  vitamin C (ASCORBIC ACID) 500 MG tablet Take 500 mg by mouth daily.   Yes Historical Provider, MD    Inpatient Medications:  . aspirin EC  325 mg Oral Daily  . calcium-vitamin D  1 tablet  Oral Daily  . cholecalciferol  500 Units Oral Daily  . citalopram  20 mg Oral Daily  . enoxaparin (LOVENOX) injection  40 mg Subcutaneous Q24H  . furosemide  40 mg Intravenous Daily  . levothyroxine  50 mcg Oral QAC breakfast  . lisinopril  20 mg Oral Daily  . metoprolol succinate  50 mg Oral Daily  . nitroGLYCERIN  0.5 inch Topical 4 times per day  . sodium chloride flush  3 mL Intravenous Q12H  . traZODone  75 mg Oral QHS  . vitamin C  500 mg Oral Daily      Allergies:  Allergies  Allergen Reactions  .  Ivp Dye [Iodinated Diagnostic Agents] Other (See Comments)    Reaction:  Dizziness   . Sulfa Antibiotics Nausea And Vomiting  . Latex Rash    Social History   Social History  . Marital Status: Legally Separated    Spouse Name: N/A  . Number of Children: N/A  . Years of Education: N/A   Occupational History  . Not on file.   Social History Main Topics  . Smoking status: Never Smoker   . Smokeless tobacco: Not on file  . Alcohol Use: 0.0 oz/week    0 Standard drinks or equivalent per week     Comment: occasionally  . Drug Use: No  . Sexual Activity: Not on file   Other Topics Concern  . Not on file   Social History Narrative   Lives at home by herself. Independent     Family History  Problem Relation Age of Onset  . Breast cancer Paternal Aunt 61  . CAD Mother   . Lung cancer Father      Review of Systems Positive for Shortness of breath PND orthopnea Negative for: General:  chills, fever, night sweats or weight changes.  Cardiovascular: Positive for PND orthopnea negative for syncope dizziness  Dermatological skin lesions rashes Respiratory: Cough congestion Urologic: Frequent urination urination at night and hematuria Abdominal: negative for nausea, vomiting, diarrhea, bright red blood per rectum, melena, or hematemesis Neurologic: negative for visual changes, and/or hearing changes  All other systems reviewed and are otherwise negative except as noted above.  Labs:  Recent Labs  05/30/15 1218 05/30/15 1846 05/31/15 0136 05/31/15 0618  TROPONINI <0.03 0.03 <0.03 <0.03   Lab Results  Component Value Date   WBC 6.0 05/31/2015   HGB 12.6 05/31/2015   HCT 35.8 05/31/2015   MCV 92.6 05/31/2015   PLT 211 05/31/2015    Recent Labs Lab 05/31/15 0618  NA 137  K 3.9  CL 107  CO2 26  BUN 14  CREATININE 0.75  CALCIUM 8.8*  GLUCOSE 96   Lab Results  Component Value Date   CHOL 221* 05/30/2015   HDL 74 05/30/2015   LDLCALC 116* 05/30/2015    TRIG 153* 05/30/2015   No results found for: DDIMER  Radiology/Studies:  Dg Chest 2 View  05/30/2015  CLINICAL DATA:  Chest pain for 2 days, initial encounter EXAM: CHEST  2 VIEW COMPARISON:  12/07/2012 FINDINGS: Cardiac shadow is at the upper limits of normal in size. Small bilateral pleural effusions are noted as well as mild vascular congestion. No focal confluent infiltrate is seen. No bony abnormality is noted. IMPRESSION: Mild vascular congestion and small pleural effusions. Electronically Signed   By: Inez Catalina M.D.   On: 05/30/2015 13:11    EKG: Normal sinus rhythm  Weights: Filed Weights   05/30/15 1210  Weight: 154 lb (  69.854 kg)     Physical Exam: Blood pressure 145/92, pulse 63, temperature 97.8 F (36.6 C), temperature source Oral, resp. rate 16, height 5\' 7"  (1.702 m), weight 154 lb (69.854 kg), SpO2 93 %. Body mass index is 24.11 kg/(m^2). General: Well developed, well nourished, in no acute distress. Head eyes ears nose throat: Normocephalic, atraumatic, sclera non-icteric, no xanthomas, nares are without discharge. No apparent thyromegaly and/or mass  Lungs: Normal respiratory effort.  no wheezes, Few basilar rales, no rhonchi.  Heart: RRR with normal S1 S2. no murmur gallop, no rub, PMI is normal size and placement, carotid upstroke normal without bruit, jugular venous pressure is normal Abdomen: Soft, non-tender, non-distended with normoactive bowel sounds. No hepatomegaly. No rebound/guarding. No obvious abdominal masses. Abdominal aorta is normal size without bruit Extremities: No edema. no cyanosis, no clubbing, no ulcers  Peripheral : 2+ bilateral upper extremity pulses, 2+ bilateral femoral pulses, 2+ bilateral dorsal pedal pulse Neuro: Alert and oriented. No facial asymmetry. No focal deficit. Moves all extremities spontaneously. Musculoskeletal: Normal muscle tone without kyphosis Psych:  Responds to questions appropriately with a normal affect.     Assessment: 70 year old female with paroxysmal nonvalvular atrial fibrillation with rapid ventricular rate over several day period with shortness of breath chest discomfort most consistent with diastolic dysfunction congestive heart failure now improved after spontaneous conversion to normal rhythm with blood pressure control and treatment of pulmonary edema without evidence of myocardial infarction  Plan: 1. Continue metoprolol for heart rate control of atrial fibrillation and maintenance of normal sinus rhythm 2. Begin ambulation and follow for improvements of symptoms 3. Anticoagulation with Eliquis 5 mg twice per day for further risk reduction in stroke with atrial fibrillation 4. Okay for discharge to home due to no evidence of myocardial infarction and for resolution of symptoms for outpatient adjustments of medications and further diagnostics as necessary  Signed, Corey Skains M.D. [facility] 05/31/2015, 8:17 AM

## 2015-05-31 NOTE — Progress Notes (Signed)
Patient back from stress test. Chelsea Miller stated okay for patient to eat. Patient will have ECHO today and will most likely discharge if everything is negative. MD will be by to see patient.

## 2015-05-31 NOTE — Plan of Care (Signed)
Problem: Pain Managment: Goal: General experience of comfort will improve Outcome: Progressing No voiced complaints of pain this shift.  SR continues on telemetry.

## 2015-05-31 NOTE — Care Management (Signed)
Patient alert, oriented, independent and active. She denies issues accessing medical care, copays or transportation. Has Medicare D coverage. Coupon given for 30 free trial of Eliquis. No additional needs anticipated. Case closed.

## 2015-05-31 NOTE — Discharge Summary (Signed)
[facility] [place] NAME: Chelsea Miller    MR#:  [id number]  DATE OF BIRTH:  [birth year]  DATE OF ADMISSION:  05/30/2015 ADMITTING PHYSICIAN: Gladstone Lighter, MD  DATE OF DISCHARGE: 05/31/15  PRIMARY CARE PHYSICIAN: Dr. Ramonita Lab   ADMISSION DIAGNOSIS:  Chest pain [R07.9] Chest pain, unspecified chest pain type [R07.9] Acute congestive heart failure, unspecified congestive heart failure type ([place]) [I50.9]  DISCHARGE DIAGNOSIS:  Active Problems:   Chest pain   SECONDARY DIAGNOSIS:   Past Medical History  Diagnosis Date  . Cancer ([place])     skin  . A-fib ([facility])     Paroxysmal afib  . Hypertension   . Hypothyroidism   . Depression   . Anxiety   . CHF (congestive heart failure) [facility])     HOSPITAL COURSE:   Chelsea Miller is a 70 y.o. female with a known history of paroxysmal atrial fibrillation not on anticoagulation, hypothyroidism, hypertension and depression and anxiety presents to the emergency room from her PCPs office due to chest pain and shortness of breath.  #1 chest pain- Due to pulm vascular congestion.Triggered by atrial fibrillation and rapid ventricular rate that happened as outpatient - Admit that for possible unstable angina. Myoview has been negative. Troponins remained negative in the hospital. -Cardiology consulted.  -Continue aspirin. Improved symptoms with Lasix. Since EF is normal and patient is in normal sinus rhythm, We'll dispense a few pills of Lasix to be taken as needed until she sees her PCP. -Cannot get VQ scan done as patient had Myoview done today. Will need to wait for 48 hours. Anyways being discharged on eliquis for her A. fib. PCP to further follow-up on this.  #2 paroxysmal atrial fibrillation- currently in normal sinus rhythm. -Continue metoprolol. Being discharged on eliquis. -Cardiology consulted.   #3 hypertension- on lisinopril and metoprolol for now. Since blood pressure is  elevated, also adding Norvasc  #4 depression and anxiety-continue home medications. On Celexa, trazodone and Xanax  #5 hypothyroidism-continue Synthroid   Being discharged today in a stable condition.  DISCHARGE CONDITIONS:   Stable  CONSULTS OBTAINED:  Treatment Team:  Corey Skains, MD  DRUG ALLERGIES:   Allergies  Allergen Reactions  . Ivp Dye [Iodinated Diagnostic Agents] Other (See Comments)    Reaction:  Dizziness   . Sulfa Antibiotics Nausea And Vomiting  . Latex Rash    DISCHARGE MEDICATIONS:   Current Discharge Medication List    START taking these medications   Details  amLODipine (NORVASC) 5 MG tablet Take 1 tablet (5 mg total) by mouth daily. Qty: 30 tablet, Refills: 2    apixaban (ELIQUIS) 5 MG TABS tablet Take 1 tablet (5 mg total) by mouth 2 (two) times daily. Qty: 60 tablet, Refills: 2    furosemide (LASIX) 20 MG tablet Take 1 tablet (20 mg total) by mouth daily as needed for fluid or edema (shortness of breath). Qty: 15 tablet, Refills: 0      CONTINUE these medications which have NOT CHANGED   Details  ALPRAZolam (XANAX) 1 MG tablet Take 1-2 mg by mouth 3 (three) times daily as needed for anxiety or sleep.    Calcium-Magnesium-Vitamin D (CALCIUM 1200+D3 PO) Take 1 tablet by mouth daily.    cholecalciferol (VITAMIN D) 1000 units tablet Take 500 Units by mouth daily.    citalopram (CELEXA) 20 MG tablet Take 20 mg by mouth daily.    estradiol (ESTRACE) 0.1 MG/GM vaginal cream Place 1  Applicatorful vaginally every 30 (thirty) days.    levothyroxine (SYNTHROID, LEVOTHROID) 50 MCG tablet Take 50 mcg by mouth daily before breakfast.    lisinopril (PRINIVIL,ZESTRIL) 20 MG tablet Take 20 mg by mouth daily.    metaxalone (SKELAXIN) 800 MG tablet Take 800 mg by mouth 3 (three) times daily as needed for muscle spasms.    metoprolol succinate (TOPROL-XL) 50 MG 24 hr tablet Take 50 mg by mouth daily. Take with or immediately following a meal.     rizatriptan (MAXALT-MLT) 10 MG disintegrating tablet Take 10 mg by mouth as needed for migraine. May repeat in 2 hours if needed    traZODone (DESYREL) 50 MG tablet Take 75 mg by mouth at bedtime.    vitamin C (ASCORBIC ACID) 500 MG tablet Take 500 mg by mouth daily.      STOP taking these medications     aspirin EC 81 MG tablet          DISCHARGE INSTRUCTIONS:   1. PCP f/u in 1 week 2. Cardiology f/u in 2 weeks  If you experience worsening of your admission symptoms, develop shortness of breath, life threatening emergency, suicidal or homicidal thoughts you must seek medical attention immediately by calling 911 or calling your MD immediately  if symptoms less severe.  You Must read complete instructions/literature along with all the possible adverse reactions/side effects for all the Medicines you take and that have been prescribed to you. Take any new Medicines after you have completely understood and accept all the possible adverse reactions/side effects.   Please note  You were cared for by a hospitalist during your hospital stay. If you have any questions about your discharge medications or the care you received while you were in the hospital after you are discharged, you can call the unit and asked to speak with the hospitalist on call if the hospitalist that took care of you is not available. Once you are discharged, your primary care physician will handle any further medical issues. Please note that NO REFILLS for any discharge medications will be authorized once you are discharged, as it is imperative that you return to your primary care physician (or establish a relationship with a primary care physician if you do not have one) for your aftercare needs so that they can reassess your need for medications and monitor your lab values.    Today   CHIEF COMPLAINT:   Chief Complaint  Patient presents with  . Chest Pain    VITAL SIGNS:  Blood pressure 161/100, pulse 65,  temperature 98.4 F (36.9 C), temperature source Oral, resp. rate 18, height 5\' 7"  (1.702 m), weight 69.854 kg (154 lb), SpO2 98 %.  I/O:   Intake/Output Summary (Last 24 hours) at 05/31/15 1411 Last data filed at 05/31/15 1130  Gross per 24 hour  Intake    361 ml  Output   1950 ml  Net  -1589 ml    PHYSICAL EXAMINATION:   Physical Exam  GENERAL:  70 y.o.-year-old patient lying in the bed with no acute distress.  EYES: Pupils equal, round, reactive to light and accommodation. No scleral icterus. Extraocular muscles intact.  HEENT: Head atraumatic, normocephalic. Oropharynx and nasopharynx clear.  NECK:  Supple, no jugular venous distention. No thyroid enlargement, no tenderness.  LUNGS: Normal breath sounds bilaterally, no wheezing, rales,rhonchi or crepitation. No use of accessory muscles of respiration.  CARDIOVASCULAR: S1, S2 normal. No murmurs, rubs, or gallops.  ABDOMEN: Soft, non-tender, non-distended. Bowel sounds present.  No organomegaly or mass.  EXTREMITIES: No pedal edema, cyanosis, or clubbing.  NEUROLOGIC: Cranial nerves II through XII are intact. Muscle strength 5/5 in all extremities. Sensation intact. Gait not checked.  PSYCHIATRIC: The patient is alert and oriented x 3.  SKIN: No obvious rash, lesion, or ulcer.   DATA REVIEW:   CBC  Recent Labs Lab 05/31/15 0618  WBC 6.0  HGB 12.6  HCT 35.8  PLT 211    Chemistries   Recent Labs Lab 05/31/15 0618  NA 137  K 3.9  CL 107  CO2 26  GLUCOSE 96  BUN 14  CREATININE 0.75  CALCIUM 8.8*    Cardiac Enzymes  Recent Labs Lab 05/31/15 0618  TROPONINI <0.03    Microbiology Results  No results found for this or any previous visit.  RADIOLOGY:  Dg Chest 2 View  05/30/2015  CLINICAL DATA:  Chest pain for 2 days, initial encounter EXAM: CHEST  2 VIEW COMPARISON:  12/07/2012 FINDINGS: Cardiac shadow is at the upper limits of normal in size. Small bilateral pleural effusions are noted as well as mild  vascular congestion. No focal confluent infiltrate is seen. No bony abnormality is noted. IMPRESSION: Mild vascular congestion and small pleural effusions. Electronically Signed   By: Inez Catalina M.D.   On: 05/30/2015 13:11   Nm Myocar Multi W/spect W/wall Motion / Ef  05/31/2015   The study is normal.  This is a low risk study.  The left ventricular ejection fraction is hyperdynamic (>65%).  There was no ST segment deviation noted during stress.     EKG:   Orders placed or performed during the hospital encounter of 05/30/15  . EKG 12-Lead  . EKG 12-Lead  . ED EKG within 10 minutes  . ED EKG within 10 minutes      Management plans discussed with the patient, family and they are in agreement.  CODE STATUS:     Code Status Orders        Start     Ordered   05/30/15 1832  Full code   Continuous     05/30/15 1831    Code Status History    Date Active Date Inactive Code Status Order ID Comments User Context   This patient has a current code status but no historical code status.      TOTAL TIME TAKING CARE OF THIS PATIENT: 37 minutes.    Gladstone Lighter M.D on 05/31/2015 at 2:11 PM  Between 7am to 6pm - Pager - [phone]  After 6pm go to [email or web address] - password [email or web address] [facility]  [facility]  Office  [phone]  CC: Primary care physician; No primary care provider on file.

## 2015-05-31 NOTE — Progress Notes (Signed)
Discharge instructions given. IV and tele removed. Prescriptions given to patient along with education. Patient has heart failure booklet, instructed to take lasix as needed at home. Will follow up with PCP. Stress test and ECHO negative. Questions answered. Patient will drive herself home.

## 2015-05-31 NOTE — Progress Notes (Signed)
*  PRELIMINARY RESULTS* Echocardiogram 2D Echocardiogram has been performed.  Chelsea Miller 05/31/2015, 2:13 PM

## 2017-11-02 IMAGING — CR DG CHEST 2V
1 series · 2 of 2 positions shown · non-contrast
Comparison: 12/07/2012

CLINICAL DATA: Chest pain for 2 days, initial encounter

EXAM:
CHEST  2 VIEW

[Series 1: dg chest 2 view · 0.14mm/px · 2 of 2 slices shown]
[im 1/2]
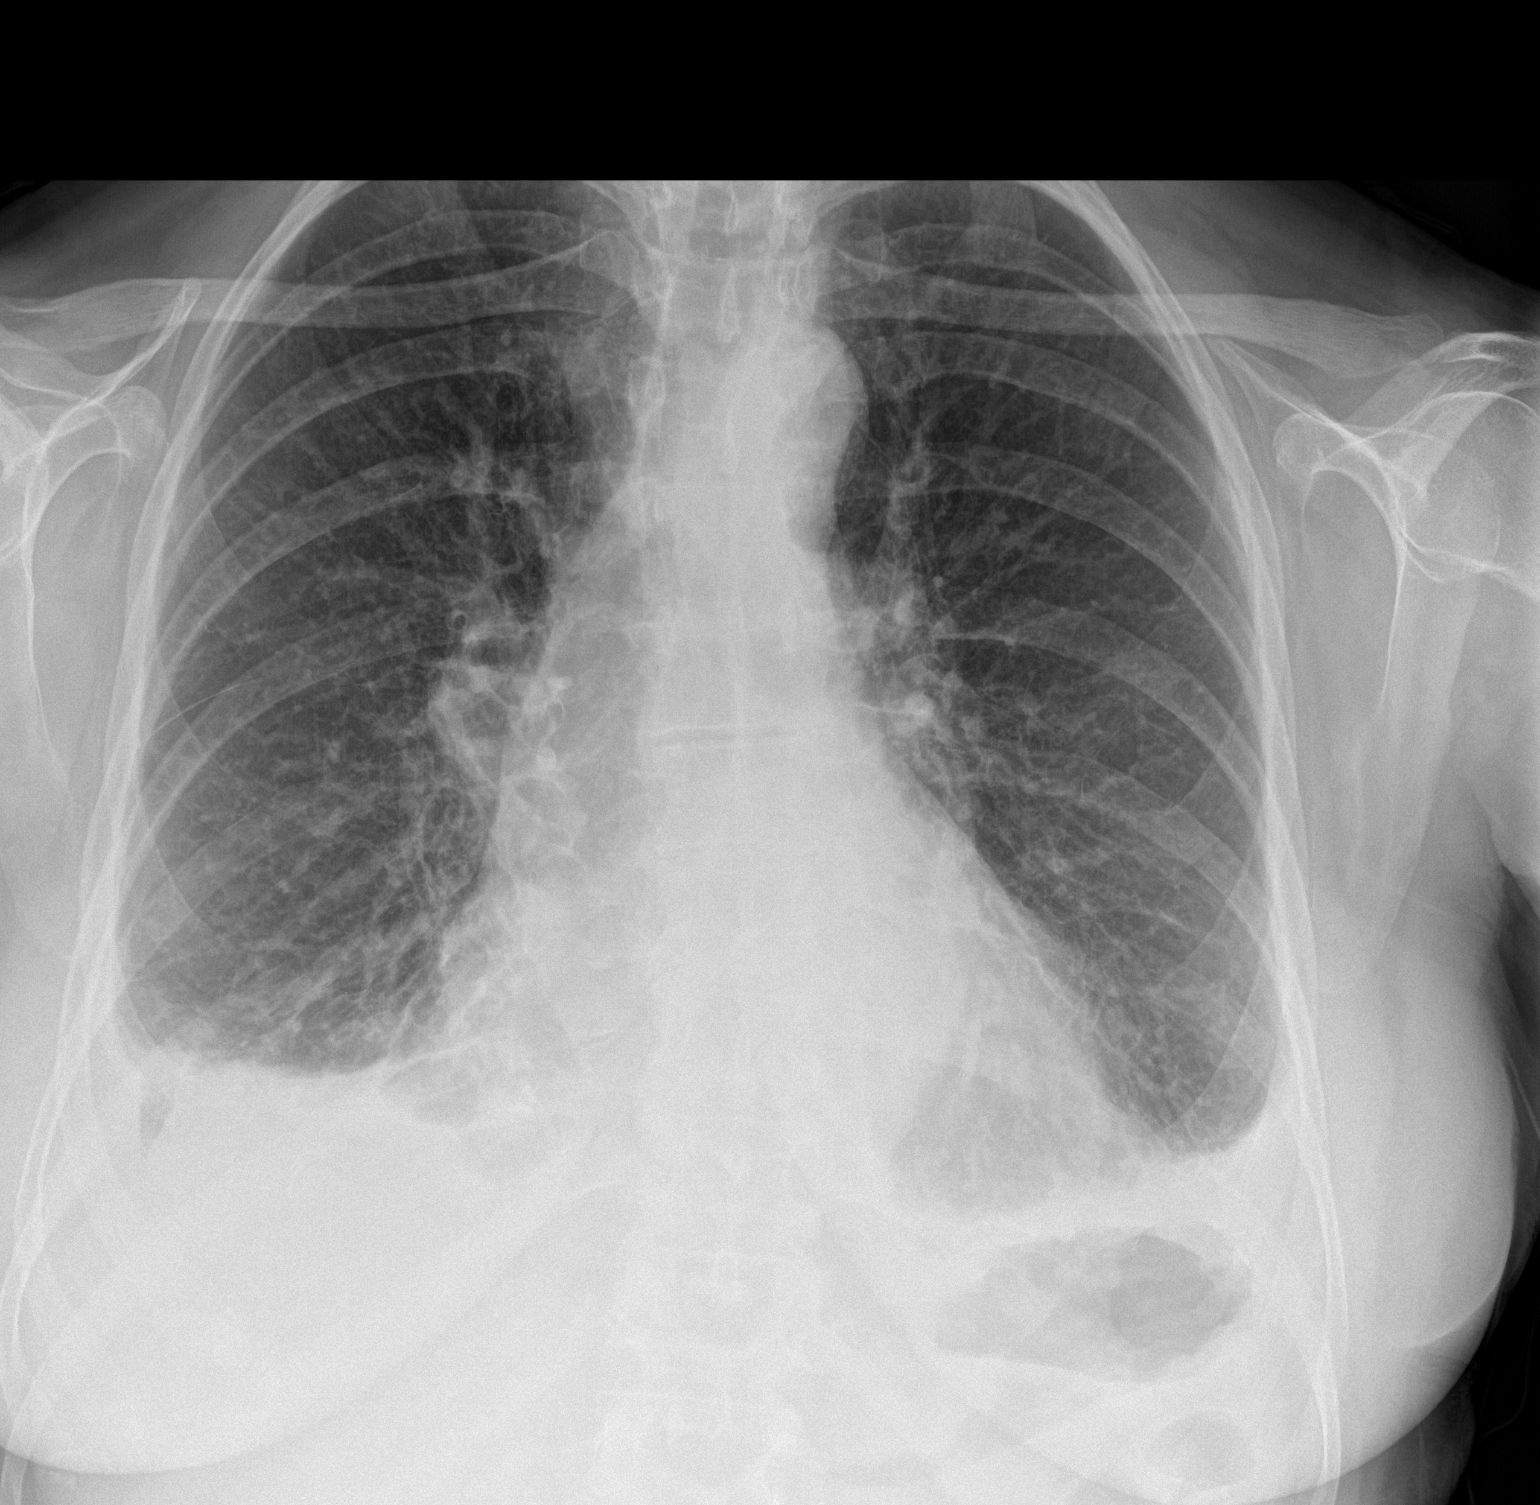
[im 2/2]
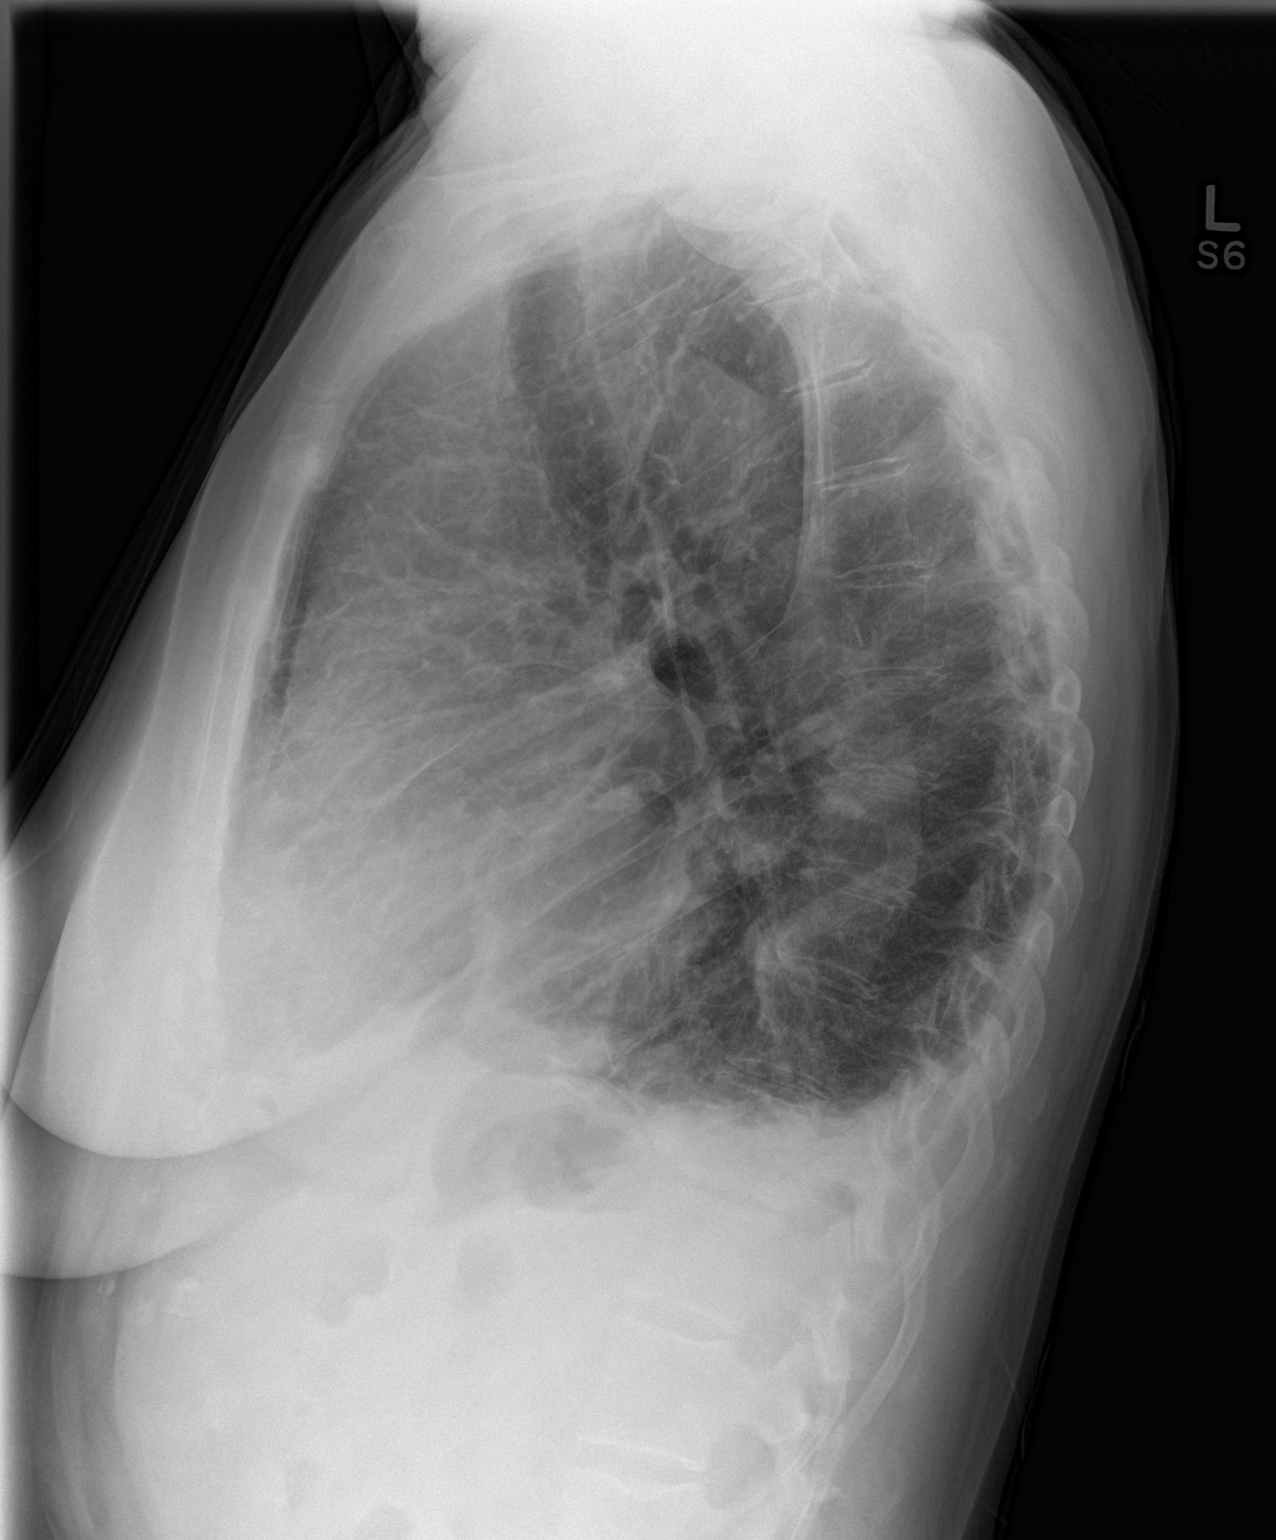

[2 of 2 positions shown; findings below may reference images not displayed]

FINDINGS: Cardiac shadow is at the upper limits of normal in size. Small
bilateral pleural effusions are noted as well as mild vascular
congestion. No focal confluent infiltrate is seen. No bony
abnormality is noted.
IMPRESSION: Mild vascular congestion and small pleural effusions.

## 2021-05-09 DEATH — deceased
